# Patient Record
Sex: Female | Born: 1945 | Race: Black or African American | Hispanic: No | Smoking: Never smoker
Health system: Southern US, Community
[De-identification: ages and names within clinical notes are randomized; demographics above are authoritative.]

## PROBLEM LIST (undated history)

## (undated) DIAGNOSIS — I10 Essential (primary) hypertension: Secondary | ICD-10-CM

## (undated) DIAGNOSIS — E785 Hyperlipidemia, unspecified: Secondary | ICD-10-CM

## (undated) DIAGNOSIS — E079 Disorder of thyroid, unspecified: Secondary | ICD-10-CM

## (undated) DIAGNOSIS — E119 Type 2 diabetes mellitus without complications: Secondary | ICD-10-CM

## (undated) DIAGNOSIS — C9000 Multiple myeloma not having achieved remission: Secondary | ICD-10-CM

## (undated) HISTORY — PX: IUD REMOVAL: SHX5392

## (undated) HISTORY — DX: Multiple myeloma not having achieved remission: C90.00

## (undated) HISTORY — DX: Hyperlipidemia, unspecified: E78.5

## (undated) HISTORY — PX: OTHER SURGICAL HISTORY: SHX169

---

## 2006-11-12 ENCOUNTER — Emergency Department: Payer: Self-pay | Admitting: Emergency Medicine

## 2016-05-23 ENCOUNTER — Emergency Department
Admission: EM | Admit: 2016-05-23 | Discharge: 2016-05-23 | Disposition: A | Discharge status: Home / Self Care | Payer: Medicare HMO | Attending: Emergency Medicine | Admitting: Emergency Medicine

## 2016-05-23 ENCOUNTER — Encounter: Payer: Self-pay | Admitting: Emergency Medicine

## 2016-05-23 ENCOUNTER — Emergency Department: Payer: Medicare HMO

## 2016-05-23 DIAGNOSIS — Z5181 Encounter for therapeutic drug level monitoring: Secondary | ICD-10-CM | POA: Diagnosis not present

## 2016-05-23 DIAGNOSIS — R55 Syncope and collapse: Secondary | ICD-10-CM

## 2016-05-23 DIAGNOSIS — Z91040 Latex allergy status: Secondary | ICD-10-CM | POA: Diagnosis not present

## 2016-05-23 DIAGNOSIS — F10120 Alcohol abuse with intoxication, uncomplicated: Secondary | ICD-10-CM | POA: Diagnosis not present

## 2016-05-23 DIAGNOSIS — F10920 Alcohol use, unspecified with intoxication, uncomplicated: Secondary | ICD-10-CM

## 2016-05-23 DIAGNOSIS — E119 Type 2 diabetes mellitus without complications: Secondary | ICD-10-CM | POA: Insufficient documentation

## 2016-05-23 DIAGNOSIS — I10 Essential (primary) hypertension: Secondary | ICD-10-CM | POA: Diagnosis not present

## 2016-05-23 DIAGNOSIS — F1290 Cannabis use, unspecified, uncomplicated: Secondary | ICD-10-CM | POA: Insufficient documentation

## 2016-05-23 HISTORY — DX: Disorder of thyroid, unspecified: E07.9

## 2016-05-23 HISTORY — DX: Essential (primary) hypertension: I10

## 2016-05-23 HISTORY — DX: Type 2 diabetes mellitus without complications: E11.9

## 2016-05-23 LAB — URINE DRUG SCREEN, QUALITATIVE (ARMC ONLY)
AMPHETAMINES, UR SCREEN: NOT DETECTED
BENZODIAZEPINE, UR SCRN: NOT DETECTED
Barbiturates, Ur Screen: NOT DETECTED
Cannabinoid 50 Ng, Ur ~~LOC~~: POSITIVE — AB
Cocaine Metabolite,Ur ~~LOC~~: NOT DETECTED
MDMA (Ecstasy)Ur Screen: NOT DETECTED
METHADONE SCREEN, URINE: NOT DETECTED
Opiate, Ur Screen: NOT DETECTED
Phencyclidine (PCP) Ur S: NOT DETECTED
TRICYCLIC, UR SCREEN: NOT DETECTED

## 2016-05-23 LAB — TROPONIN I
TROPONIN I: 0.04 ng/mL — AB (ref <0.03)
TROPONIN I: 0.03 ng/mL — AB (ref <0.03)
Troponin I: 0.03 ng/mL (ref <0.03)

## 2016-05-23 LAB — COMPREHENSIVE METABOLIC PANEL
ALT: 16 U/L (ref 14–54)
ANION GAP: 8 (ref 5–15)
AST: 24 U/L (ref 15–41)
Albumin: 4.3 g/dL (ref 3.5–5.0)
Alkaline Phosphatase: 41 U/L (ref 38–126)
BUN: 21 mg/dL — AB (ref 6–20)
CHLORIDE: 106 mmol/L (ref 101–111)
CO2: 25 mmol/L (ref 22–32)
Calcium: 9.7 mg/dL (ref 8.9–10.3)
Creatinine, Ser: 1.24 mg/dL — ABNORMAL HIGH (ref 0.44–1.00)
GFR calc non Af Amer: 44 mL/min — ABNORMAL LOW (ref >60)
GFR, EST AFRICAN AMERICAN: 51 mL/min — AB (ref >60)
Glucose, Bld: 169 mg/dL — ABNORMAL HIGH (ref 65–99)
POTASSIUM: 3.2 mmol/L — AB (ref 3.5–5.1)
Sodium: 139 mmol/L (ref 135–145)
Total Bilirubin: 0.5 mg/dL (ref 0.3–1.2)
Total Protein: 8.9 g/dL — ABNORMAL HIGH (ref 6.5–8.1)

## 2016-05-23 LAB — ETHANOL: ALCOHOL ETHYL (B): 205 mg/dL — AB (ref <5)

## 2016-05-23 LAB — CBC
HCT: 36.8 % (ref 35.0–47.0)
Hemoglobin: 12.3 g/dL (ref 12.0–16.0)
MCH: 30.2 pg (ref 26.0–34.0)
MCHC: 33.4 g/dL (ref 32.0–36.0)
MCV: 90.3 fL (ref 80.0–100.0)
PLATELETS: 192 10*3/uL (ref 150–440)
RBC: 4.07 MIL/uL (ref 3.80–5.20)
RDW: 13.6 % (ref 11.5–14.5)
WBC: 11.9 10*3/uL — AB (ref 3.6–11.0)

## 2016-05-23 LAB — TSH: TSH: 4.323 u[IU]/mL (ref 0.350–4.500)

## 2016-05-23 LAB — GLUCOSE, CAPILLARY: GLUCOSE-CAPILLARY: 166 mg/dL — AB (ref 65–99)

## 2016-05-23 MED ORDER — SODIUM CHLORIDE 0.9 % IV BOLUS (SEPSIS)
1000.0000 mL | Freq: Once | INTRAVENOUS | Status: AC
  Administered 2016-05-23: 1000 mL via INTRAVENOUS

## 2016-05-23 NOTE — ED Notes (Signed)
Patient states that she had a 2nd pair of ear rings in when she arrived. Patient states they was a clear stone. Per CT patient removed the only pair of ear rings which was dangles and placed in a specimen cup. Patient does have that pair in her possession.

## 2016-05-23 NOTE — ED Notes (Signed)
Patient is asleep. o2 sat 87-95%

## 2016-05-23 NOTE — ED Notes (Signed)
Patient ambulated to bathroom. Was a little unsteady on feet when she first stood up. More steady from toilet to bed. Patient does report feeling dizzy.

## 2016-05-23 NOTE — ED Notes (Signed)
Pt assisted to bathroom, assisted back to bed, resting in bed in no acute distress

## 2016-05-23 NOTE — ED Provider Notes (Signed)
[facility] Emergency Department Provider Note    First MD Initiated Contact with Patient 05/23/16 [phone]     (approximate)  I have reviewed the triage vital signs and the nursing notes.   HISTORY  Chief Complaint Loss of Consciousness and Alcohol Intoxication   HPI Christina Steele is a 71 y.o. female with blow list of chronic medical conditions presents to the emergency department via EMS with history of losing consciousness while "having a party in a parking lot". Patient admits to EtOH ingestion tonight as well as marijuana use. Patient does not recall falling. Patient denies any complaints at present.   Past Medical History:  Diagnosis Date  . Diabetes mellitus without complication (HCC)   . Hypertension   . Thyroid disease     There are no active problems to display for this patient.   History reviewed. No pertinent surgical history.  Prior to Admission medications   Not on File    Allergies Latex and Penicillins  No family history on file.  Social History Social History  Substance Use Topics  . Smoking status: Never Smoker  . Smokeless tobacco: Never Used  . Alcohol use Yes    Review of Systems Constitutional: No fever/chills Eyes: No visual changes. ENT: No sore throat. Cardiovascular: Denies chest pain. Respiratory: Denies shortness of breath. Gastrointestinal: No abdominal pain.  No nausea, no vomiting.  No diarrhea.  No constipation. Genitourinary: Negative for dysuria. Musculoskeletal: Negative for back pain. Skin: Negative for rash. Neurological: Negative for headaches, focal weakness or numbness. Psychiatric:Positive for EtOH and cannabinoid use  10-point ROS otherwise negative.  ____________________________________________   PHYSICAL EXAM:  VITAL SIGNS: ED Triage Vitals  Enc Vitals Group     BP 05/23/16 0058 (!) 146/96     Pulse Rate 05/23/16 0058 65     Resp 05/23/16 0058 18     Temp 05/23/16 0058 98 F  (36.7 C)     Temp Source 05/23/16 0058 Oral     SpO2 05/23/16 0058 96 %     Weight 05/23/16 0059 256 lb (116.1 kg)     Height 05/23/16 0059  (1.727 m)     Head Circumference --      Peak Flow --      Pain Score --      Pain Loc --      Pain Edu? --      Excl. in GC? --     Constitutional: Alert and oriented. Appears intoxicated. Eyes: Conjunctivae are normal. PERRL. EOMI. Head: Atraumatic. Mouth/Throat: Mucous membranes are moist.  Oropharynx non-erythematous. Neck: No stridor.  No meningeal signs.  No cervical spine tenderness to palpation. Cardiovascular: Normal rate, regular rhythm. Good peripheral circulation. Grossly normal heart sounds. Respiratory: Normal respiratory effort.  No retractions. Lungs CTAB. Gastrointestinal: Soft and nontender. No distention.  Musculoskeletal: No lower extremity tenderness nor edema. No gross deformities of extremities. Neurologic:  Normal speech and language. No gross focal neurologic deficits are appreciated.  Skin:  Skin is warm, dry and intact. No rash noted. Psychiatric: Mood and affect are normal. Speech and behavior are normal.  ____________________________________________   LABS (all labs ordered are listed, but only abnormal results are displayed)  Labs Reviewed  GLUCOSE, CAPILLARY - Abnormal; Notable for the following:       Result Value   Glucose-Capillary 166 (*)    All other components within normal limits  CBC - Abnormal; Notable for the following:    WBC 11.9 (*)  All other components within normal limits  COMPREHENSIVE METABOLIC PANEL - Abnormal; Notable for the following:    Potassium 3.2 (*)    Glucose, Bld 169 (*)    BUN 21 (*)    Creatinine, Ser 1.24 (*)    Total Protein 8.9 (*)    GFR calc non Af Amer 44 (*)    GFR calc Af Amer 51 (*)    All other components within normal limits  TROPONIN I - Abnormal; Notable for the following:    Troponin I 0.03 (*)    All other components within normal limits    ETHANOL - Abnormal; Notable for the following:    Alcohol, Ethyl (B) 205 (*)    All other components within normal limits  URINE DRUG SCREEN, QUALITATIVE ([facility] ONLY) - Abnormal; Notable for the following:    Cannabinoid 50 Ng, Ur [place] POSITIVE (*)    All other components within normal limits  TSH   ____________________________________________  EKG  ED ECG REPORT I, [place] N BROWN, the attending physician, personally viewed and interpreted this ECG.   Date: 05/23/2016  EKG Time: 1:23 AM  Rate: 123  Rhythm: Sinus tachycardia  Axis: Normal  Intervals: Normal  ST&T Change: None  ____________________________________________  RADIOLOGY I, Nixa N BROWN, personally viewed and evaluated these images (plain radiographs) as part of my medical decision making, as well as reviewing the written report by the radiologist.  Ct Head Wo Contrast  Result Date: 05/23/2016 CLINICAL DATA:  Acute onset of altered mental status. Initial encounter. EXAM: CT HEAD WITHOUT CONTRAST TECHNIQUE: Contiguous axial images were obtained from the base of the skull through the vertex without intravenous contrast. COMPARISON:  None. FINDINGS: Brain: No evidence of acute infarction, hemorrhage, hydrocephalus, extra-axial collection or mass lesion/mass effect. Prominence of the ventricles and sulci reflects mild cortical volume loss. Mild periventricular white matter change likely reflects small vessel ischemic microangiopathy. The brainstem and fourth ventricle are within normal limits. The basal ganglia are unremarkable in appearance. The cerebral hemispheres demonstrate grossly normal gray-white differentiation. No mass effect or midline shift is seen. Vascular: No hyperdense vessel or unexpected calcification. Skull: There is no evidence of fracture; visualized osseous structures are unremarkable in appearance. Sinuses/Orbits: Bilateral proptosis is noted. The paranasal sinuses and mastoid air cells are  well-aerated. Other: No significant soft tissue abnormalities are seen. IMPRESSION: 1. No acute intracranial pathology seen on CT. 2. Mild cortical volume loss and scattered small vessel ischemic microangiopathy. 3. Bilateral proptosis noted. Electronically Signed   By: Roanna Raider M.D.   On: 05/23/2016 02:08      Procedures   ____________________________________________   INITIAL IMPRESSION / ASSESSMENT AND PLAN / ED COURSE  Pertinent labs & imaging results that were available during my care of the patient were reviewed by me and considered in my medical decision making (see chart for details).  71 year old female presenting with witnessed syncopal episode. Patient with alcohol intoxication as well as marijuana use tonight. Laboratory data notable for troponin 0.03 and subsequently 0.04 and 0.03 with no chest pain shortness of breath or any other symptoms at this time. Patient alert and oriented 4 with no complaints at this time.      ____________________________________________  FINAL CLINICAL IMPRESSION(S) / ED DIAGNOSES  Final diagnoses:  Alcoholic intoxication without complication (HCC)  Syncope, unspecified syncope type     MEDICATIONS GIVEN DURING THIS VISIT:  Medications  sodium chloride 0.9 % bolus 1,000 mL (1,000 mLs Intravenous New Bag/Given 05/23/16 0203)  NEW OUTPATIENT MEDICATIONS STARTED DURING THIS VISIT:  New Prescriptions   No medications on file    Modified Medications   No medications on file    Discontinued Medications   No medications on file     Note:  This document was prepared using Dragon voice recognition software and may include unintentional dictation errors.    Darci Current, MD 05/23/16 [phone]

## 2016-07-01 ENCOUNTER — Other Ambulatory Visit: Payer: Self-pay | Admitting: Family Medicine

## 2016-07-01 DIAGNOSIS — Z780 Asymptomatic menopausal state: Secondary | ICD-10-CM

## 2016-08-28 ENCOUNTER — Other Ambulatory Visit: Payer: Medicare HMO

## 2017-08-19 ENCOUNTER — Other Ambulatory Visit: Payer: Self-pay | Admitting: Family Medicine

## 2017-08-19 DIAGNOSIS — Z780 Asymptomatic menopausal state: Secondary | ICD-10-CM

## 2017-09-21 ENCOUNTER — Ambulatory Visit
Admission: RE | Admit: 2017-09-21 | Discharge: 2017-09-21 | Disposition: A | Discharge status: Home / Self Care | Payer: Medicare HMO | Source: Ambulatory Visit | Attending: Family Medicine | Admitting: Family Medicine

## 2017-09-21 DIAGNOSIS — Z780 Asymptomatic menopausal state: Secondary | ICD-10-CM | POA: Insufficient documentation

## 2020-11-30 DIAGNOSIS — Z23 Encounter for immunization: Secondary | ICD-10-CM | POA: Diagnosis not present

## 2021-07-15 ENCOUNTER — Ambulatory Visit (LOCAL_COMMUNITY_HEALTH_CENTER): Payer: Medicare HMO

## 2021-07-15 DIAGNOSIS — Z719 Counseling, unspecified: Secondary | ICD-10-CM

## 2021-07-15 DIAGNOSIS — Z23 Encounter for immunization: Secondary | ICD-10-CM

## 2021-07-15 NOTE — Progress Notes (Signed)
  Are you feeling sick today? No   Have you ever received a dose of COVID-19 Vaccine? [employer], [place], [place], [place], Other) Yes  If yes, which vaccine and how many doses?   4 Moderna   Did you bring the vaccination record card or other documentation?  No   Do you have a health condition or are undergoing treatment that makes you moderately or severely immunocompromised? This would include, but not be limited to: cancer, HIV, organ transplant, immunosuppressive therapy/high-dose corticosteroids, or moderate/severe primary immunodeficiency.  Yes - bone cancere  Have you received COVID-19 vaccine before or during hematopoietic cell transplant (HCT) or CAR-T-cell therapies? No  Have you ever had an allergic reaction to: (This would include a severe allergic reaction or a reaction that caused hives, swelling, or respiratory distress, including wheezing.) A component of a COVID-19 vaccine or a previous dose of COVID-19 vaccine? No   Have you ever had an allergic reaction to another vaccine (other thanCOVID-19 vaccine) or an injectable medication? (This would include a severe allergic reaction or a reaction that caused hives, swelling, or respiratory distress, including wheezing.)   No    Do you have a history of any of the following:  Myocarditis or Pericarditis No  Dermal fillers:  No  Multisystem Inflammatory Syndrome (MIS-C or MIS-A)? No  COVID-19 disease within the past 3 months? No  Vaccinated with monkeypox vaccine in the last 4 weeks? No  Patient seen in Nurse's clinic for COVID booster.  Patient has been diagnosed with bone cancer.  MD note dated 07/03/2021 - OK for patient to get COVID and Shingles vaccinations. Moderna BV administered in right deltoid.  Patient tolerated well.

## 2023-12-08 NOTE — Progress Notes (Signed)
 Referring Provider: Lorel Byars Achirimofo*   PCP:  [facility], [facility]  12/08/2023  Chief Complaint: follow-up for CKD  Background:  Christina Steele is a 78 y.o. female with a h/o DM, HTN, and CKD who was referred for CKD/elevated Cr and first seen in 09/2020. Per notes, baseline Cr had been ~1.1-1.4 for a couple of years prior to referral and was up to 1.66 in 08/2020 (from 1.42 in 05/2019).   With regards to her DM, reports she was diagnosed at 78 y/o. Complicated by microalbuminuria. Denies any retinopathy (states she has seen eye doctor, though not since COVID) or neuropathy.   With regards to her HTN, states she's had this as long as she can remember - perhaps since her 30s.   Reports kidney infection around 50 or 78 y/o with which she was sick for a couple months, and had to delay a year of school. No other kidney infections or issues in childhood or adulthood until learning of CKD more recently.   Reports shingles about 3-4 years prior to initial evaluation here, had a lot of pain, reports taking a lot of Aleve for this over 2-3 weeks.  Had another bout of shingles affecting her R arm in February-March 2022. Took Tylenol for this. Denies any NSAID use at that time, and denies any NSAID use outside of the episode of shingles 3-4 years prior.   Diagnosed with multiple myeloma in early 2023, following with [place] Benay Rain at [facility] myeloma clinic.   HPI: Christina Steele returns today for follow-up.   She was seen on 10/1 at multiple myeloma clinic.  At that time her multiple myeloma was noted to be in remission/clinically stable with no signs of disease progression on lenalidomide maintenance monotherapy.  She has had intermittent diarrhea thought to be related to lenalidomide which most recently is noted by oncology team to be improved.  She has been prescribed cholestyramine for the diarrhea but sounds like she avoids using these when she can.  However, she has  often reported problems with diarrhea when I see her in clinic (sounds like some of these are attributed to viral illness/GI bug) and I worry about whether episodic volume depletion/hypotension could be causing mild episodes of AKI.   History of Present Illness Christina Steele is a 78 year old female who presents for nephrology follow-up.  Hypokalemia - Persistent hypokalemia since cancer diagnosis - Managed with daily potassium supplementation - Potassium levels remain at the lower end of normal - Significant hypokalemia episodes in March 2025 and two weeks ago - Episodes of diarrhea, particularly after consuming sugar-free candies and leafy greens, may contribute to low potassium levels  Hypercalcemia - Intermittently elevated calcium levels - On calcium and vitamin D supplementation since early 2025 - Questions if supplementation contributes to elevated calcium levels  Iron deficiency - Takes iron supplements twice daily for low iron levels - Iron levels have improved but remain suboptimal for someone with kidney disease  Blood pressure control - Blood pressure is well-controlled with recent adjustments to amlodipine dosage - Monitors blood pressure regularly - No symptoms of dizziness or lightheadedness  Home BP log:     PAST MEDICAL HISTORY: Multiple myeloma  HTN DM CKD H/o cataract surgery H/o kidney infection as a child (34-12 y/o)  ALLERGIES Ace inhibitors, Latex, and Penicillins  SOCIAL HISTORY Social History   Socioeconomic History  . Marital status: Widowed  Tobacco Use  . Smoking status: Never  . Smokeless  tobacco: Never  Vaping Use  . Vaping status: Never Used  Substance and Sexual Activity  . Alcohol use: Not Currently    Comment: occasional  . Drug use: Yes    Frequency: 10.0 times per week    Types: Marijuana    Comment: 1 - 1.5 uses per day (smoking)  . Sexual activity: Yes   Social Drivers of Health   Financial Resource Strain: Medium  Risk (06/14/2021)   Overall Financial Resource Strain (CARDIA)   . Difficulty of Paying Living Expenses: Somewhat hard  Food Insecurity: No Food Insecurity (06/14/2021)   Hunger Vital Sign   . Worried About Programme Researcher, Broadcasting/film/video in the Last Year: Never true   . Ran Out of Food in the Last Year: Never true  Transportation Needs: No Transportation Needs (06/14/2021)   PRAPARE - Transportation   . Lack of Transportation (Medical): No   . 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 amLODIPine (NORVASC) 10 MG tablet Take 1 tablet (10 mg total) by mouth daily. (Patient taking differently: Take 0.5 tablets (5 mg total) by mouth in the morning.)    . aspirin (ECOTRIN) 81 MG tablet Take 1 tablet (81 mg total) by mouth daily.    SABRA atorvastatin (LIPITOR) 10 MG tablet Take 1 tablet (10 mg total) by mouth daily.    . chlorthalidone (HYGROTON) 25 MG tablet TAKE 1 TABLET EVERY MORNING 90 tablet 3  . ergocalciferol-1,250 mcg, 50,000 unit, (VITAMIN D2-1,250 MCG, 50,000 UNIT,) 1,250 mcg (50,000 unit) capsule Take 1 capsule (1,250 mcg total) by mouth once a week.    . ferrous sulfate 325 (65 FE) MG EC tablet Take 1 tablet (325 mg total) by mouth Three (3) times a day with a meal. (Patient taking differently: Take 1 tablet (325 mg total) by mouth two (2) times a day.)    . JARDIANCE 25 mg tablet     . lenalidomide (REVLIMID) 5 mg cap capsule Take 1 capsule (5 mg) by mouth daily for 21 days, followed by 7 days of rest. 21 capsule 0  . levothyroxine (SYNTHROID) 100 MCG tablet Take 1 tablet (100 mcg total) by mouth daily.    . potassium  chloride 20 MEQ ER tablet Take 1 tablet (20 mEq total) by mouth daily. 30 tablet 0  . prochlorperazine (COMPAZINE) 10 MG tablet Take 1 tablet (10 mg total) by mouth every six (6) hours as needed for nausea. 30 tablet 1  . semaglutide 0.25 mg or 0.5 mg (2 mg/3 mL) PnIj Inject 0.5 mg under the skin once a week.    . telmisartan (MICARDIS) 80 MG tablet TAKE 1 TABLET EVERY DAY 90 tablet 3  . zolpidem (AMBIEN) 5 MG tablet Take 1 tablet (5 mg total) by mouth nightly as needed.     No current facility-administered medications for this visit.   PHYSICAL EXAM:   Vitals:   12/08/23 1352  BP: 127/55  Pulse: (!) 47   CONSTITUTIONAL: Alert, well appearing, no distress HEENT: Extra ocular movements intact. Sclerae anicteric. CARDIOVASCULAR: RRR PULM: Clear to auscultation bilaterally EXTREMITIES: No lower extremity edema bilaterally.  NEUROLOGIC: No focal motor deficits PSYCH: alert and oriented x 3  MEDICAL DECISION MAKING  Results for orders placed or performed in visit on 12/01/23  Iron Panel  Result Value Ref Range   Iron 59 50 - 170 ug/dL   TIBC 747 749 - 574 ug/dL   Iron Saturation (%) 23 20 - 55 %  Ferritin  Result Value Ref Range   Ferritin 217.6 7.3 - 270.7 ng/mL  Basic metabolic panel  Result Value Ref Range   Sodium 139 135 - 145 mmol/L   Potassium 4.1 3.5 - 5.1 mmol/L   Chloride 104 98 - 107 mmol/L   CO2 26.0 20.0 - 31.0 mmol/L   Anion Gap 9 5 - 14 mmol/L   BUN 28 (H) 9 - 23 mg/dL   Creatinine 7.84 (H) 9.44 - 1.02 mg/dL   BUN/Creatinine Ratio 13    eGFR CKD-EPI (2021) Female 23 (L) >=60 mL/min/1.41m2   Glucose 266 (H) 70 - 179 mg/dL   Calcium 89.4 (H) 8.7 - 10.4 mg/dL  Lipid Panel  Result Value Ref Range   Cholesterol, Total 140 <200 mg/dL   Cholesterol, HDL 62 >49 mg/dL   Cholesterol, LDL, Calculated 68 <100 mg/dL   Cholesterol, Non-HDL, Calculated 78 <130 mg/dL   Triglycerides 67 <849 mg/dL   Fasting Unknown   Albumin/creatinine urine ratio  Result Value Ref  Range   Creat U 95.9 Undefined mg/dL   Albumin Quantitative, Urine <0.3 Undefined mg/dL   Albumin/Creatinine Ratio <3.1 0.0 - 30.0 ug/mg  Protein/Creatinine Ratio, Urine  Result Value Ref Range   Creat U 95.9 Undefined mg/dL   Protein, Ur <3.9 Undefined mg/dL   Protein/Creatinine Ratio, Urine       Creatinine  Date Value Ref Range Status  12/01/2023 2.15 (H) 0.55 - 1.02 mg/dL Final  89/98/7974 8.03 (H) 0.55 - 1.02 mg/dL Final  92/97/7974 7.81 (H) 0.55 - 1.02 mg/dL Final  96/73/7974 8.10 (H) 0.55 - 1.02 mg/dL Final  87/81/7975 7.90 (H) 0.55 - 1.02 mg/dL Final     No results for input(s): NA, K, CL, BUN, CREATININE, GFR, GLU in the last 24 hours.  Invalid input(s): C02  03/05/18 - Cr 1.44  06/14/19 - Cr 1.42  08/30/20- Cr 1.66, eGFR 33, K 3.7, bicarb 22, Ca 10.5, A1C 8.0  IMAGING STUDIES:   Renal US  11/19/20-  FINDINGS: KIDNEYS: Normal size and slightly increased  echogenicity. Lobulated contours. No solid masses or calculi. No evidence of hydronephrosis. BLADDER: Unremarkable. Left and right ureteral jets visible. IMPRESSION: -No evidence of hydronephrosis. Please see below for data measurements: Right kidney: 11.6 cm  Left kidney:  10.4cm  Bladder volume prevoid: 50.7  mL   ASSESSMENT/PLAN:  ChristinaChristina Steele is a 78 y.o. patient with a past medical history significant for DM, HTN, and CKD who is being seen in clinic.   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 Suspect 2/2 DM as this has been present nearly 20 years with microalbuminuria (though no retinopathy); HTN likely also contributing as she has had this for 30-40+ years. Urine sediment unrevealing. She reports heavy NSAID use a few years ago, but this was only for a few weeks - possibly she had some AKI at that time though unlikely a significant contributor. Renal US  unrevealing. Renal function has declined over time.  Lab Results  Component Value Date   CREATININE 2.15 (H)  12/01/2023  - Cr has ranged ~1.9-2.1 or 2.2 the last year (since late 2024), she  is within this range but has progressed over time. eGFR 23.  - The patient has been counseled on avoiding nephrotoxic agents including but not limited to NSAIDs, has been avoiding these - On atorvastatin 10mg  daily. LDL goal <70 (DM, HTN, CKD with prior albuminuria). At goal.  Lab Results  Component Value Date   LDL 68 12/01/2023  - Management of DM and HTN as below - H/o albuminuria, most recently UACR undetectable. On max dose ARB + SGLT2i, continue.  - Dose medications for eGFR ~20-30  Bone Mineral Metabolism:  Lab Results  Component Value Date   PTH 287.0 (H) 01/14/2022   CALCIUM 10.5 (H) 12/01/2023  - On Ca/Vit D supplement BID per PCP due to bone density test - Dietary Ca - eats ice cream 3-4 days/week, eats yogurt about once a week, eats cheese sometimes, milk rarely. Has some dietary Ca though not adequate; she reports she started on Ca supplement BID earlier this year. Will have her decrease Ca supplement to once daily.   - Intermittent hypercalcemia since 04/2023. Reviewed dietary Ca intake, not excessive and not meeting goals for age so her supplement was appropriate. She is on thiazide --> decrease chlorthalidone to 12.5mg  daily. She has multiple myeloma but is in very good partial response with stable disease.  - Recheck PTH today  Anemia:  Lab Results  Component Value Date   HGB 11.3 11/25/2023   Lab Results  Component Value Date   IRON 59 12/01/2023   TIBC 252 12/01/2023   FERRITIN 217.6 12/01/2023  - Tsat 23, ferritin 218 - improved though still c/w iron deficiency in the setting of her CKD. She is on oral iron BID, continue. Reports she's tolerating this.   HTN: Goal <130/80.   BP Readings from Last 3 Encounters:  12/08/23 127/55  11/25/23 158/55  08/26/23 98/54   Lab Results  Component Value Date   K 4.1 12/01/2023  - Current meds: telmisartan 80mg  daily, amlodipine 5mg  daily  (decreased early October), chlorthalidone 25mg  daily - Had orthostasis with carvedilol, stopped previously - Home BPs the last 6 months have been 100s-110s. Reports PCP cut her amlodipine from 10mg  to 5mg  a couple weeks ago.  - Decrease chlorthalidone 25-->12.5mg  daily. If needed, would favor stopping chlorthalidone entirely (even if amlodipine needs to be increased again), given hypercalcemia and hypokalemia  Hypokalemia: Has had a couple episodes of hypokalemia; K usually in low normal range (mid 3s).  Down to 2.9 in 04/2022. - Most recently K down to 3.0 on 10/1, she was given 40 mEq KCl in clinic and then started on KCl 20 mEq daily.  K improved to 4.1 on 10/7.  - She is on max dose ARB though also on thiazide diuretic.  I worry that diarrhea/GI losses could contribute at times though she feels that recently this has been overall better recently.  - Decreasing chlorthalidone 25-->12.5mg  as above. With this will have her decrease KCl to 10mEq daily and recheck labs in 1-2 weeks.  - Check Mg today (has never been checked that I can see).   DM:  - Urine microalbumin undetectable.  - Not currently on metformin due to GI side effects, would not restart at her current eGFR - Current meds: Ozempic 1mg  weekly, Jardiance 25mg  daily (though no significant glycemic effect at her current GFR)   Christina Steele will follow up in 3 months.  The patient will need BMP, UACR, UPC, PTH within 4 weeks prior to next visit.

## 2024-01-04 NOTE — Progress Notes (Unsigned)
 Cardiology Office Note:    Date:  01/08/2024   ID:  Christina Steele, DOB 10-Jul-1945, MRN [id number]  PCP:  No primary care provider on file.  Cardiologist:  Christina LITTIE Nanas, MD  Electrophysiologist:  None   Referring MD: Christina Maxie LABOR, MD   Chief Complaint  Patient presents with   Bradycardia    History of Present Illness:    Christina Steele is a 78 y.o. female with a hx of multiple myeloma in remission, T2DM, hypertension, hypothyroidism, CKD stage IV who is referred by Dr. Lorel for evaluation of bradycardia.  Denies any chest pain, dyspnea, lightheadedness, syncope, lower extremity edema, or palpitations.  Walks 3 days per week for 30 minutes.  Denies any exertional symptoms.  Never smoked.  No family history of heart disease.   Past Medical History:  Diagnosis Date   Diabetes mellitus without complication (HCC)    Hyperlipidemia    Hypertension    Multiple myeloma (HCC)    Thyroid  disease     Past Surgical History:  Procedure Laterality Date   cataracts     IUD REMOVAL      Current Medications: Current Meds  Medication Sig   amLODipine (NORVASC) 5 MG tablet Take 5 mg by mouth.   aspirin EC 81 MG tablet Take 81 mg by mouth daily. Swallow whole.   atorvastatin (LIPITOR) 10 MG tablet Take 10 mg by mouth daily.   Calcium Carb-Cholecalciferol (CALCIUM 600+D) 600-10 MG-MCG TABS Take by mouth.   chlorthalidone (HYGROTON) 25 MG tablet Take 12.5 mg by mouth.   ferrous sulfate 325 (65 FE) MG EC tablet Take 325 mg by mouth 3 (three) times daily with meals.   glipiZIDE (GLUCOTROL XL) 10 MG 24 hr tablet Take 10 mg by mouth daily with breakfast.   JARDIANCE 10 MG TABS tablet Take 10 mg by mouth daily.   lenalidomide (REVLIMID) 5 MG capsule Take 5 mg by mouth daily.   levothyroxine (SYNTHROID) 100 MCG tablet Take 100 mcg by mouth daily before breakfast.   potassium chloride SA (KLOR-CON M) 20 MEQ tablet Take 20 mEq by mouth daily.   Semaglutide, 1 MG/DOSE,  (OZEMPIC, 1 MG/DOSE,) 4 MG/3ML SOPN Inject into the skin.   telmisartan (MICARDIS) 80 MG tablet Take 80 mg by mouth daily.   zolpidem (AMBIEN) 5 MG tablet Take 5 mg by mouth at bedtime as needed.   [DISCONTINUED] chlorthalidone (HYGROTON) 25 MG tablet Take 25 mg by mouth daily. (Patient taking differently: Take 12.5 mg by mouth daily.)   [DISCONTINUED] traZODone (DESYREL) 50 MG tablet Take 50 mg by mouth at bedtime.     Allergies:   Ace inhibitors, Latex, and Penicillins   Social History   Socioeconomic History   Marital status: Widowed    Spouse name: Not on file   Number of children: Not on file   Years of education: Not on file   Highest education level: Not on file  Occupational History   Not on file  Tobacco Use   Smoking status: Never   Smokeless tobacco: Never  Substance and Sexual Activity   Alcohol use: Yes   Drug use: Yes    Types: Marijuana   Sexual activity: Not on file  Other Topics Concern   Not on file  Social History Narrative   Not on file   Social Drivers of Health   Financial Resource Strain: Medium Risk (06/14/2021)   Received from [facility]   Overall Financial Resource Strain (CARDIA)  Difficulty of Paying Living Expenses: Somewhat hard  Food Insecurity: No Food Insecurity (06/14/2021)   Received from [facility]   Hunger Vital Sign    Within the past 12 months, you worried that your food would run out before you got the money to buy more.: Never true    Within the past 12 months, the food you bought just didn't last and you didn't have money to get more.: Never true  Transportation Needs: No Transportation Needs (06/14/2021)   Received from [facility] - Transportation    Lack of Transportation (Medical): No    Lack of Transportation (Non-Medical): No  Physical Activity: Not on file  Stress: Not on file  Social Connections: Not on file     Family History: The patient's family history includes Breast cancer in her  maternal grandmother; Hypertension in her brother; Leukemia in her child; Rheum arthritis in her mother.  ROS:   Please see the history of present illness.     All other systems reviewed and are negative.  EKGs/Labs/Other Studies Reviewed:    The following studies were reviewed today:   EKG:   01/08/2024: Sinus bradycardia, rate 42, LVH  Recent Labs: No results found for requested labs within last 365 days.  Recent Lipid Panel No results found for: CHOL, TRIG, HDL, CHOLHDL, VLDL, LDLCALC, LDLDIRECT  Physical Exam:    VS:  BP 124/60   Pulse (!) 177   Ht 5' 8 (1.727 m)   Wt 177 lb (80.3 kg)   SpO2 99%   BMI 26.91 kg/m     Wt Readings from Last 3 Encounters:  01/08/24 177 lb (80.3 kg)  05/23/16 256 lb (116.1 kg)     GEN:  Well nourished, well developed in no acute distress HEENT: Normal NECK: No JVD; No carotid bruits LYMPHATICS: No lymphadenopathy CARDIAC: Bradycardia, regular, 2 out of 6 systolic murmur RESPIRATORY:  Clear to auscultation without rales, wheezing or rhonchi  ABDOMEN: Soft, non-tender, non-distended MUSCULOSKELETAL:  No edema; No deformity  SKIN: Warm and dry NEUROLOGIC:  Alert and oriented x 3 PSYCHIATRIC:  Normal affect   ASSESSMENT:    1. Bradycardia   2. Heart murmur   3. Essential hypertension   4. Hyperlipidemia, unspecified hyperlipidemia type   5. CKD (chronic kidney disease), stage IV (HCC)    PLAN:    Bradycardia: Sinus bradycardia in 40s in clinic today.  Appears asymptomatic.  Not on any AV nodal blockers.  Check TSH/free T4.  Plan Zio patch x 2 weeks for further evaluation  Murmur: 2 out of 6 systolic murmur, check echocardiogram  Hypertension: On amlodipine 5 mg daily and telmisartan 80 mg daily and chlorthalidone 12.5 mg daily  Hyperlipidemia: On atorvastatin 10 mg daily  T2DM: On Jardiance, glipizide, Ozempic  CKD stage IV: Follows with nephrology at [facility]. Cr 1.9 on 12/22/23  Multiple myeloma: In  remission, follows with hematology at [facility]  [facility] in 3 months   Medication Adjustments/Labs and Tests Ordered: Current medicines are reviewed at length with the patient today.  Concerns regarding medicines are outlined above.  Orders Placed This Encounter  Procedures   TSH   T4, free   LONG TERM MONITOR (3-14 DAYS)   EKG 12-Lead   ECHOCARDIOGRAM COMPLETE   No orders of the defined types were placed in this encounter.   Patient Instructions  Medication Instructions:  Your physician recommends that you continue on your current medications as directed. Please refer to the Current Medication  list given to you today.  *If you need a refill on your cardiac medications before your next appointment, please call your pharmacy*  Lab Work: Today: TSH and Free T4 If you have labs (blood work) drawn today and your tests are completely normal, you will receive your results only by: MyChart Message (if you have MyChart) OR A paper copy in the mail If you have any lab test that is abnormal or we need to change your treatment, we will call you to review the results.  Testing/Procedures: Echocardiogram Your physician has requested that you have an echocardiogram. Echocardiography is a painless test that uses sound waves to create images of your heart. It provides your doctor with information about the size and shape of your heart and how well your heart's chambers and valves are working. This procedure takes approximately one hour. There are no restrictions for this procedure. Please do NOT wear cologne, perfume, aftershave, or lotions (deodorant is allowed). Please arrive 15 minutes prior to your appointment time.  Please note: We ask at that you not bring children with you during ultrasound (echo/ vascular) testing. Due to room size and safety concerns, children are not allowed in the ultrasound rooms during exams. Our front office staff cannot provide observation of children in our lobby area while  testing is being conducted. An adult accompanying a patient to their appointment will only be allowed in the ultrasound room at the discretion of the ultrasound technician under special circumstances. We apologize for any inconvenience.   Zio Montior ZIO XT- Long Term Monitor Instructions  Your physician has requested you wear a ZIO patch monitor for ___14_ days.   This is a single patch monitor. [employer] supplies one patch monitor per enrollment. Additional  stickers are not available. Please do not apply patch if you will be having a Nuclear Stress Test,  Echocardiogram, Cardiac CT, MRI, or Chest Xray during the period you would be wearing the  monitor. The patch cannot be worn during these tests. You cannot remove and re-apply the  ZIO XT patch monitor.   Your ZIO patch monitor will be mailed 3 day USPS to your address on file. It may take 3-5 days  to receive your monitor after you have been enrolled.   Once you have received your monitor, please review the enclosed instructions. Your monitor  has already been registered assigning a specific monitor serial # to you.     Billing and Patient Assistance Program Information  We have supplied [employer] with any of your insurance information on file for billing purposes.  [employer] offers a sliding scale Patient Assistance Program for patients that do not have  insurance, or whose insurance does not completely cover the cost of the ZIO monitor.  You must apply for the Patient Assistance Program to qualify for this discounted rate.   To apply, please call [employer] at [phone], select option 4, select option 2, ask to apply for  Patient Assistance Program. Meredeth will ask your household income, and how many people  are in your household. They will quote your out-of-pocket cost based on that information.  [employer] will also be able to set up a 14-month, interest-free payment plan if needed.     Applying the monitor  Shave hair from upper left  chest.  Hold abrader disc by orange tab. Rub abrader in 40 strokes over the upper left chest as  indicated in your monitor instructions.  Clean area with 4 enclosed alcohol pads. Let dry.  Apply patch as indicated in monitor instructions. Patch will be placed under collarbone on left  side of chest with arrow pointing upward.  Rub patch adhesive wings for 2 minutes. Remove white label marked 1. Remove the white  label marked 2. Rub patch adhesive wings for 2 additional minutes.  While looking in a mirror, press and release button in center of patch. A small green light will  flash 3-4 times. This will be your only indicator that the monitor has been turned on.  Do not shower for the first 24 hours. You may shower after the first 24 hours.  Press the button if you feel a symptom. You will hear a small click. Record Date, Time and  Symptom in the Patient Logbook.  When you are ready to remove the patch, follow instructions on the last 2 pages of Patient  Logbook. Stick patch monitor onto the last page of Patient Logbook.   Place Patient Logbook in the blue and white box. Use locking tab on box and tape box closed  securely. The blue and white box has prepaid postage on it. Please place it in the mailbox as  soon as possible. Your physician should have your test results approximately 7 days after the  monitor has been mailed back to [facility]   Call [facility] Customer Care at [phone] if you have questions regarding  your ZIO XT patch monitor. Call them immediately if you see an orange light blinking on your  monitor.   If your monitor falls off in less than 4 days, contact our Monitor department at [phone].   If your monitor becomes loose or falls off after 4 days call [employer] at [phone] for  suggestions on securing your monitor.    Follow-Up: At [facility], you and your health needs are our priority.  As part of our continuing mission to  provide you with exceptional heart care, our providers are all part of one team.  This team includes your primary Cardiologist (physician) and Advanced Practice Providers or APPs (Physician Assistants and Nurse Practitioners) who all work together to provide you with the care you need, when you need it.  Your next appointment:   3 month(s)  Provider:   Lonni LITTIE Nanas, MD    We recommend signing up for the patient portal called MyChart.  Sign up information is provided on this After Visit Summary.  MyChart is used to connect with patients for Virtual Visits (Telemedicine).  Patients are able to view lab/test results, encounter notes, upcoming appointments, etc.  Non-urgent messages can be sent to your provider as well.   To learn more about what you can do with MyChart, go to [email or web address].   Other Instructions None            Signed, Christina LITTIE Nanas, MD  01/08/2024 2:50 PM    [facility]

## 2024-01-08 ENCOUNTER — Encounter: Payer: Self-pay | Admitting: Cardiology

## 2024-01-08 ENCOUNTER — Ambulatory Visit: Attending: Cardiology | Admitting: Cardiology

## 2024-01-08 VITALS — BP 124/60 | HR 177 | Ht 68.0 in | Wt 177.0 lb

## 2024-01-08 DIAGNOSIS — I10 Essential (primary) hypertension: Secondary | ICD-10-CM

## 2024-01-08 DIAGNOSIS — R011 Cardiac murmur, unspecified: Secondary | ICD-10-CM | POA: Diagnosis not present

## 2024-01-08 DIAGNOSIS — E785 Hyperlipidemia, unspecified: Secondary | ICD-10-CM | POA: Diagnosis not present

## 2024-01-08 DIAGNOSIS — R001 Bradycardia, unspecified: Secondary | ICD-10-CM

## 2024-01-08 DIAGNOSIS — N184 Chronic kidney disease, stage 4 (severe): Secondary | ICD-10-CM

## 2024-01-08 NOTE — Patient Instructions (Addendum)
 Medication Instructions:  Your physician recommends that you continue on your current medications as directed. Please refer to the Current Medication list given to you today.  *If you need a refill on your cardiac medications before your next appointment, please call your pharmacy*  Lab Work: Today: TSH and Free T4 If you have labs (blood work) drawn today and your tests are completely normal, you will receive your results only by: MyChart Message (if you have MyChart) OR A paper copy in the mail If you have any lab test that is abnormal or we need to change your treatment, we will call you to review the results.  Testing/Procedures: Echocardiogram Your physician has requested that you have an echocardiogram. Echocardiography is a painless test that uses sound waves to create images of your heart. It provides your doctor with information about the size and shape of your heart and how well your heart's chambers and valves are working. This procedure takes approximately one hour. There are no restrictions for this procedure. Please do NOT wear cologne, perfume, aftershave, or lotions (deodorant is allowed). Please arrive 15 minutes prior to your appointment time.  Please note: We ask at that you not bring children with you during ultrasound (echo/ vascular) testing. Due to room size and safety concerns, children are not allowed in the ultrasound rooms during exams. Our front office staff cannot provide observation of children in our lobby area while testing is being conducted. An adult accompanying a patient to their appointment will only be allowed in the ultrasound room at the discretion of the ultrasound technician under special circumstances. We apologize for any inconvenience.   Zio Montior ZIO XT- Long Term Monitor Instructions  Your physician has requested you wear a ZIO patch monitor for ___14_ days.   This is a single patch monitor. [employer] supplies one patch monitor per enrollment.  Additional  stickers are not available. Please do not apply patch if you will be having a Nuclear Stress Test,  Echocardiogram, Cardiac CT, MRI, or Chest Xray during the period you would be wearing the  monitor. The patch cannot be worn during these tests. You cannot remove and re-apply the  ZIO XT patch monitor.   Your ZIO patch monitor will be mailed 3 day USPS to your address on file. It may take 3-5 days  to receive your monitor after you have been enrolled.   Once you have received your monitor, please review the enclosed instructions. Your monitor  has already been registered assigning a specific monitor serial # to you.     Billing and Patient Assistance Program Information  We have supplied [employer] with any of your insurance information on file for billing purposes.  [employer] offers a sliding scale Patient Assistance Program for patients that do not have  insurance, or whose insurance does not completely cover the cost of the ZIO monitor.  You must apply for the Patient Assistance Program to qualify for this discounted rate.   To apply, please call [employer] at [phone], select option 4, select option 2, ask to apply for  Patient Assistance Program. Meredeth will ask your household income, and how many people  are in your household. They will quote your out-of-pocket cost based on that information.  [employer] will also be able to set up a 62-month, interest-free payment plan if needed.     Applying the monitor  Shave hair from upper left chest.  Hold abrader disc by orange tab. Rub abrader in 40 strokes over the upper  left chest as  indicated in your monitor instructions.  Clean area with 4 enclosed alcohol pads. Let dry.  Apply patch as indicated in monitor instructions. Patch will be placed under collarbone on left  side of chest with arrow pointing upward.  Rub patch adhesive wings for 2 minutes. Remove white label marked 1. Remove the white  label marked 2. Rub patch  adhesive wings for 2 additional minutes.  While looking in a mirror, press and release button in center of patch. A small green light will  flash 3-4 times. This will be your only indicator that the monitor has been turned on.  Do not shower for the first 24 hours. You may shower after the first 24 hours.  Press the button if you feel a symptom. You will hear a small click. Record Date, Time and  Symptom in the Patient Logbook.  When you are ready to remove the patch, follow instructions on the last 2 pages of Patient  Logbook. Stick patch monitor onto the last page of Patient Logbook.   Place Patient Logbook in the blue and white box. Use locking tab on box and tape box closed  securely. The blue and white box has prepaid postage on it. Please place it in the mailbox as  soon as possible. Your physician should have your test results approximately 7 days after the  monitor has been mailed back to [facility].   Call [facility] Customer Care at [phone] if you have questions regarding  your ZIO XT patch monitor. Call them immediately if you see an orange light blinking on your  monitor.   If your monitor falls off in less than 4 days, contact our Monitor department at [phone].   If your monitor becomes loose or falls off after 4 days call [employer] at [phone] for  suggestions on securing your monitor.    Follow-Up: At [facility], you and your health needs are our priority.  As part of our continuing mission to provide you with exceptional heart care, our providers are all part of one team.  This team includes your primary Cardiologist (physician) and Advanced Practice Providers or APPs (Physician Assistants and Nurse Practitioners) who all work together to provide you with the care you need, when you need it.  Your next appointment:   3 month(s)  Provider:   Lonni LITTIE Nanas, MD    We recommend signing up for the patient portal called MyChart.   Sign up information is provided on this After Visit Summary.  MyChart is used to connect with patients for Virtual Visits (Telemedicine).  Patients are able to view lab/test results, encounter notes, upcoming appointments, etc.  Non-urgent messages can be sent to your provider as well.   To learn more about what you can do with MyChart, go to [email or web address].   Other Instructions None

## 2024-01-09 LAB — TSH: TSH: 0.259 u[IU]/mL — ABNORMAL LOW (ref 0.450–4.500)

## 2024-01-09 LAB — T4, FREE: Free T4: 1.53 ng/dL (ref 0.82–1.77)

## 2024-01-11 ENCOUNTER — Ambulatory Visit: Payer: Self-pay | Admitting: Cardiology

## 2024-01-11 ENCOUNTER — Ambulatory Visit: Attending: Cardiology

## 2024-01-11 DIAGNOSIS — R001 Bradycardia, unspecified: Secondary | ICD-10-CM

## 2024-01-11 NOTE — Progress Notes (Unsigned)
 Enrolled for [employer] to mail a ZIO XT long term holter monitor to the patients address on file.

## 2024-02-03 ENCOUNTER — Ambulatory Visit: Attending: Cardiology

## 2024-02-03 DIAGNOSIS — R011 Cardiac murmur, unspecified: Secondary | ICD-10-CM

## 2024-02-03 LAB — ECHOCARDIOGRAM COMPLETE
AR max vel: 2.43 cm2
AV Area VTI: 2.35 cm2
AV Area mean vel: 2.28 cm2
AV Mean grad: 9 mmHg
AV Peak grad: 17 mmHg
Ao pk vel: 2.06 m/s
Area-P 1/2: 2.44 cm2
Calc EF: 43.3 %
P 1/2 time: 818 ms
S' Lateral: 4.6 cm
Single Plane A2C EF: 42 %
Single Plane A4C EF: 46.9 %

## 2024-02-19 DIAGNOSIS — R001 Bradycardia, unspecified: Secondary | ICD-10-CM

## 2024-03-31 ENCOUNTER — Ambulatory Visit: Admitting: Cardiology

## 2024-07-07 ENCOUNTER — Ambulatory Visit: Admitting: Cardiology
